# Patient Record
Sex: Female | Born: 1960 | Race: White | Hispanic: No | Marital: Married | Smoking: Never smoker
Health system: Southern US, Community
[De-identification: ages and names within clinical notes are randomized; demographics above are authoritative.]

## PROBLEM LIST (undated history)

## (undated) DIAGNOSIS — G459 Transient cerebral ischemic attack, unspecified: Secondary | ICD-10-CM

## (undated) HISTORY — PX: OTHER SURGICAL HISTORY: SHX169

## (undated) HISTORY — DX: Transient cerebral ischemic attack, unspecified: G45.9

## (undated) HISTORY — PX: WISDOM TOOTH EXTRACTION: SHX21

---

## 1998-02-16 ENCOUNTER — Other Ambulatory Visit: Admission: RE | Admit: 1998-02-16 | Discharge: 1998-02-16 | Payer: Self-pay | Admitting: Family Medicine

## 2002-08-17 ENCOUNTER — Other Ambulatory Visit: Admission: RE | Admit: 2002-08-17 | Discharge: 2002-08-17 | Payer: Self-pay | Admitting: Obstetrics and Gynecology

## 2006-11-06 ENCOUNTER — Encounter: Admission: RE | Admit: 2006-11-06 | Discharge: 2006-11-06 | Payer: Self-pay | Admitting: Obstetrics and Gynecology

## 2012-03-09 ENCOUNTER — Other Ambulatory Visit: Payer: Self-pay | Admitting: Obstetrics and Gynecology

## 2012-03-09 DIAGNOSIS — N6009 Solitary cyst of unspecified breast: Secondary | ICD-10-CM

## 2012-03-12 ENCOUNTER — Ambulatory Visit
Admission: RE | Admit: 2012-03-12 | Discharge: 2012-03-12 | Disposition: A | Discharge status: Home / Self Care | Payer: BC Managed Care – PPO | Source: Ambulatory Visit | Attending: Obstetrics and Gynecology | Admitting: Obstetrics and Gynecology

## 2012-03-12 DIAGNOSIS — N6009 Solitary cyst of unspecified breast: Secondary | ICD-10-CM

## 2012-03-12 IMAGING — US US BREAST*L*
1 series · 4 of 4 positions shown · non-contrast
Comparison: With priors

CLINICAL DATA: Palpable left breast abnormality

DIGITAL DIAGNOSTIC BILATERAL MAMMOGRAM WITH CAD AND LEFT BREAST
ULTRASOUND:

[Series 1: us breast*left* · 4 of 4 slices shown]
[im 1/4]
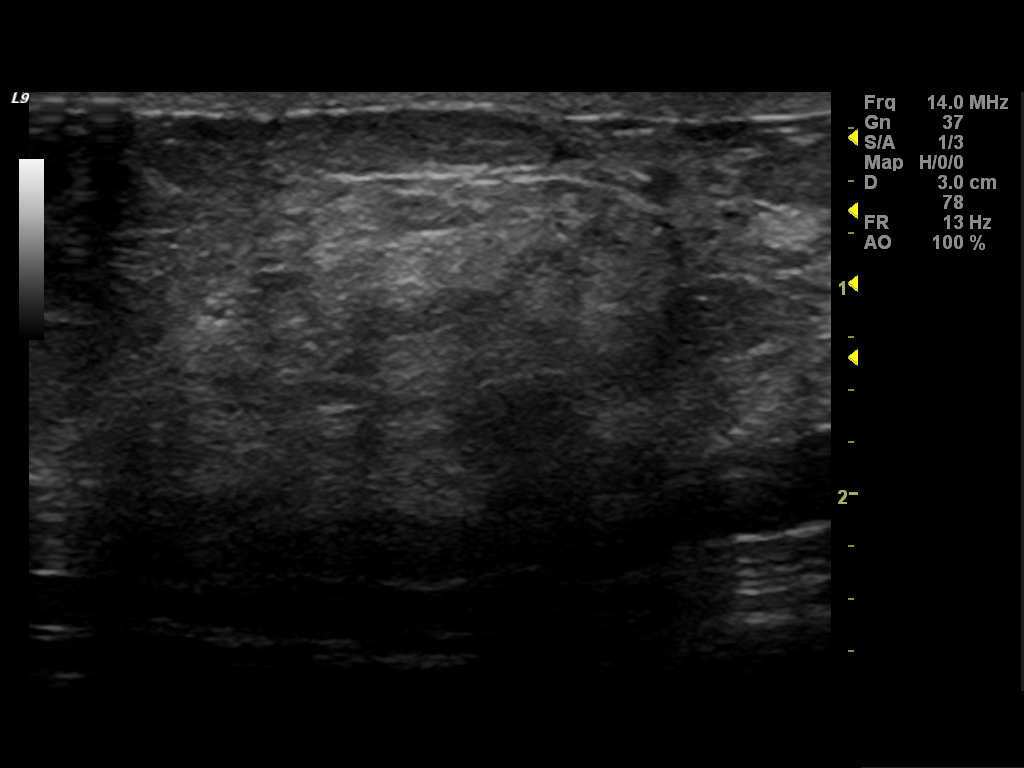
[im 2/4]
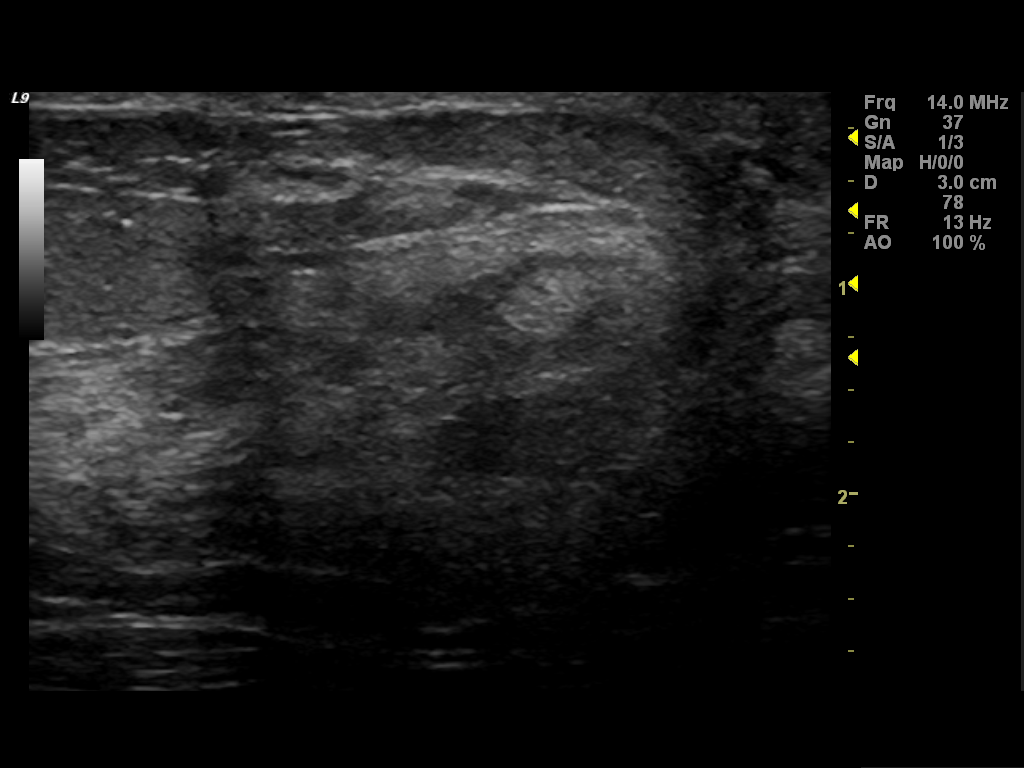
[im 3/4]
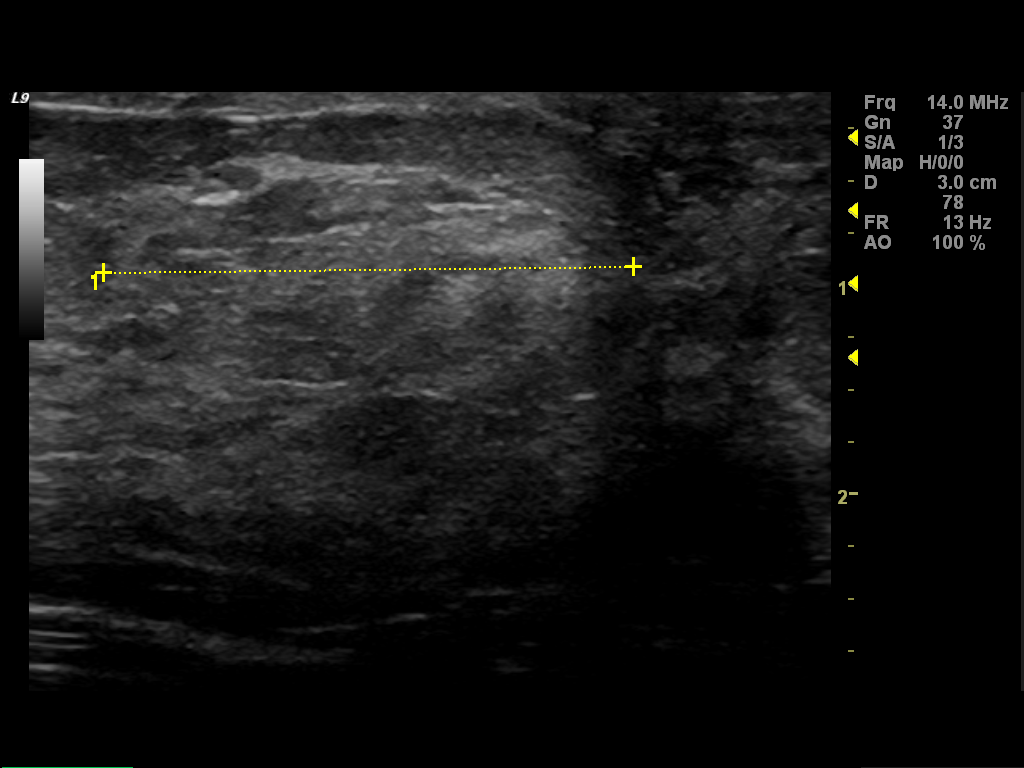
[im 4/4]
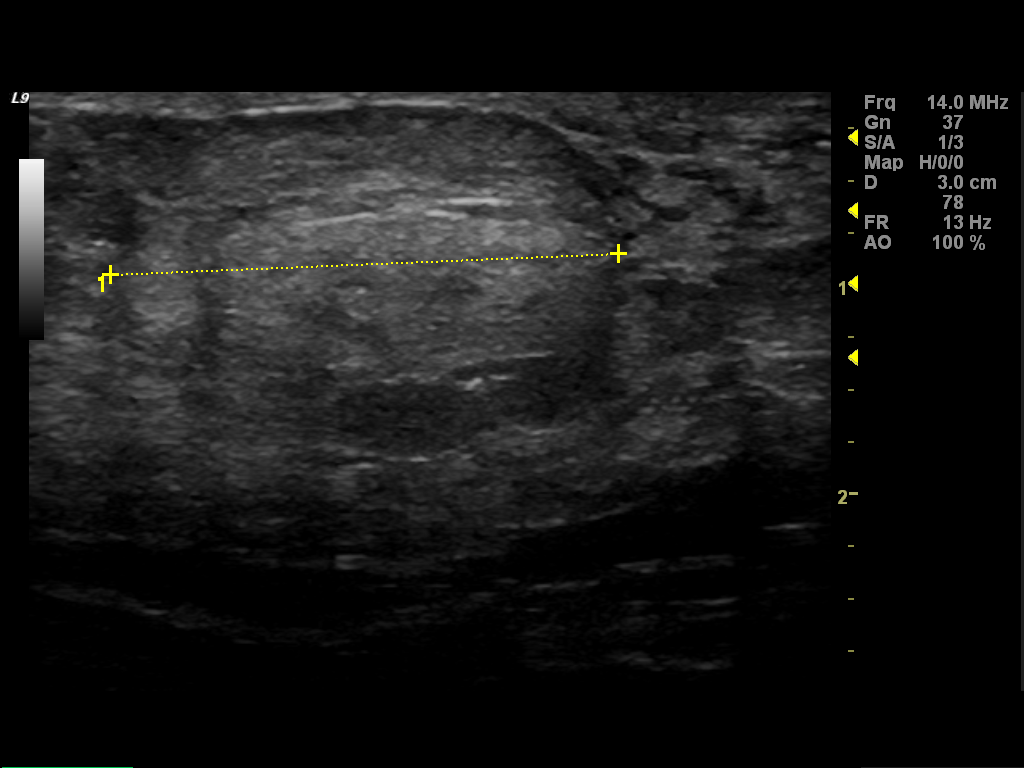

[4 of 4 positions shown; findings below may reference images not displayed]

FINDINGS: There is a dense fibroglandular pattern.  The right
breast is negative.

In the 1 o'clock, subareolar region of the left breast where the
patient can feel a palpable abnormality, there is a 1.7 cm rounded
area of normal breast tissue thought to represent a hamartoma.  No
suspicious mass or malignant-type microcalcifications identified in
the left breast.
Mammographic images were processed with CAD.

On physical exam, I palpate an area of thickening in the left
breast at 1 o'clock in a subareolar location.

Ultrasound is performed, showing there is a rounded area of mixed
echogenic tissue with central fat measuring 2.4 cm.  It has the
appearance of a hamartoma.
IMPRESSION: Probable benign hamartoma in the left breast.

RECOMMENDATION:
Short-term interval follow-up left ultrasound in 6 months is
recommended.  The importance of self breast examination was
discussed with the patient.

I have discussed the findings and recommendations with the patient.
Results were also provided in writing at the conclusion of the
visit.

BI-RADS CATEGORY 3:  Probably benign finding(s) - short interval
follow-up suggested.

## 2014-01-30 ENCOUNTER — Ambulatory Visit (INDEPENDENT_AMBULATORY_CARE_PROVIDER_SITE_OTHER): Payer: BC Managed Care – PPO | Admitting: Family Medicine

## 2014-01-30 ENCOUNTER — Encounter: Payer: Self-pay | Admitting: Family Medicine

## 2014-01-30 ENCOUNTER — Encounter (INDEPENDENT_AMBULATORY_CARE_PROVIDER_SITE_OTHER): Payer: Self-pay

## 2014-01-30 ENCOUNTER — Telehealth: Payer: Self-pay | Admitting: Family Medicine

## 2014-01-30 VITALS — BP 132/78 | HR 106 | Temp 97.6°F | Ht 65.5 in | Wt 153.0 lb

## 2014-01-30 DIAGNOSIS — J069 Acute upper respiratory infection, unspecified: Secondary | ICD-10-CM

## 2014-01-30 MED ORDER — AMOXICILLIN 875 MG PO TABS: 875.0000 mg | ORAL_TABLET | Freq: Two times a day (BID) | ORAL | Status: DC

## 2014-01-30 NOTE — Progress Notes (Signed)
   Subjective:    Patient ID: Tanya Morgan, female    DOB: 12/08/1960, 53 y.o.   MRN: [id number]  HPI This 53 y.o. female presents for evaluation of URI sx's and left eye conjunctivitis. She was rx'd ofloxacin 1 gtt qid and she is not getting better.   Review of Systems C/o uri sx's and left eye drainage. No chest pain, SOB, HA, dizziness, vision change, N/V, diarrhea, constipation, dysuria, urinary urgency or frequency, myalgias, arthralgias or rash.     Objective:   Physical Exam Vital signs noted  Well developed well nourished female.  HEENT - Head atraumatic Normocephalic                Eyes - PERRLA, Conjuctiva - injected OS clear OD Sclera- Clear EOMI                Ears - EAC's Wnl TM's Wnl Gross Hearing WNL                Throat - oropharanx wnl Respiratory - Lungs CTA bilateral Cardiac - RRR S1 and S2 without murmur GI - Abdomen soft Nontender and bowel sounds active x 4 Extremities - No edema. Neuro - Grossly intact.       Assessment & Plan:  URI (upper respiratory infection) - Plan: amoxicillin (AMOXIL) 875 MG tablet Push po fluids, rest, tylenol and motrin otc prn as directed for fever, arthralgias, and myalgias.  Follow up prn if sx's continue or persist.  Conjunctivitis - Increase eye gtt's to 2 gtt's OS every 2 hours W/A for a day and then 1 gtt OS q 2hours then 1 gtt every 3 hours then 1 gtt every 4 hours until total of 7 days.  Deatra Canter FNP

## 2014-01-30 NOTE — Telephone Encounter (Signed)
Eye swollen. bcbs and not taking any medications appt given for today at 4:30

## 2014-02-11 ENCOUNTER — Telehealth: Payer: Self-pay | Admitting: Family Medicine

## 2014-02-20 ENCOUNTER — Other Ambulatory Visit: Payer: Self-pay | Admitting: Family Medicine

## 2014-02-20 MED ORDER — OFLOXACIN 0.3 % OP SOLN: 1.0000 [drp] | Freq: Four times a day (QID) | OPHTHALMIC | Status: AC

## 2015-01-22 ENCOUNTER — Encounter: Payer: Self-pay | Admitting: Gastroenterology

## 2015-01-25 ENCOUNTER — Ambulatory Visit (AMBULATORY_SURGERY_CENTER): Payer: Self-pay

## 2015-01-25 VITALS — Ht 65.5 in | Wt 149.8 lb

## 2015-01-25 DIAGNOSIS — Z1211 Encounter for screening for malignant neoplasm of colon: Secondary | ICD-10-CM

## 2015-01-25 MED ORDER — MOVIPREP 100 G PO SOLR: 1.0000 | Freq: Once | ORAL | Status: DC

## 2015-01-25 NOTE — Progress Notes (Signed)
No allergies to eggs or soy No past problems with anesthesia No diet/weight loss meds No home oxygen  Has email  Emmi instructions given for colonoscopy 

## 2015-01-26 ENCOUNTER — Other Ambulatory Visit: Payer: Self-pay

## 2015-02-07 ENCOUNTER — Ambulatory Visit (AMBULATORY_SURGERY_CENTER): Payer: BLUE CROSS/BLUE SHIELD | Admitting: Gastroenterology

## 2015-02-07 ENCOUNTER — Encounter: Payer: Self-pay | Admitting: Gastroenterology

## 2015-02-07 VITALS — BP 110/56 | HR 78 | Temp 97.0°F | Resp 14 | Ht 65.0 in | Wt 149.0 lb

## 2015-02-07 DIAGNOSIS — K5730 Diverticulosis of large intestine without perforation or abscess without bleeding: Secondary | ICD-10-CM

## 2015-02-07 DIAGNOSIS — Z1211 Encounter for screening for malignant neoplasm of colon: Secondary | ICD-10-CM | POA: Diagnosis not present

## 2015-02-07 MED ORDER — SODIUM CHLORIDE 0.9 % IV SOLN: 500.0000 mL | INTRAVENOUS | Status: DC

## 2015-02-07 NOTE — Op Note (Signed)
[facility] [address] [place], [address]   COLONOSCOPY PROCEDURE REPORT  PATIENT: Tanya, Morgan  MR#: [id number] BIRTHDATE: 07-Feb-1961 , 54  yrs. old GENDER: female ENDOSCOPIST: Rachael Fee, MD REFERRED XB:[id number] Vincente Poli, M.D. PROCEDURE DATE:  02/07/2015 PROCEDURE:   Colonoscopy, screening First Screening Colonoscopy - Avg.  risk and is 50 yrs.  old or older Yes.  Prior Negative Screening - Now for repeat screening. N/A  History of Adenoma - Now for follow-up colonoscopy & has been > or = to 3 yrs.  N/A  Recommend repeat exam, <10 yrs? No ASA CLASS:   Class II INDICATIONS:Screening for colonic neoplasia and Colorectal Neoplasm Risk Assessment for this procedure is average risk. MEDICATIONS: Monitored anesthesia care and Propofol 150 mg IV  DESCRIPTION OF PROCEDURE:   After the risks benefits and alternatives of the procedure were thoroughly explained, informed consent was obtained.  The digital rectal exam revealed no abnormalities of the rectum.   The LB [id number]  endoscope was introduced through the anus and advanced to the cecum, which was identified by both the appendix and ileocecal valve. No adverse events experienced.   The quality of the prep was excellent.  The instrument was then slowly withdrawn as the colon was fully examined. Estimated blood loss is zero unless otherwise noted in this procedure report.   COLON FINDINGS: There was mild diverticulosis noted throughout the entire examined colon.   The examination was otherwise normal. Retroflexed views revealed no abnormalities. The time to cecum = 2.3 Withdrawal time = 6.7   The scope was withdrawn and the procedure completed. COMPLICATIONS: There were no immediate complications.  ENDOSCOPIC IMPRESSION: 1.   Mild diverticulosis was noted throughout the entire examined colon 2.   The examination was otherwise normal  RECOMMENDATIONS: You should continue to follow colorectal  cancer screening guidelines for "routine risk" patients with a repeat colonoscopy in 10 years. There is no need for FOBT (stool) testing for at least 5 years.  eSigned:  Rachael Fee, MD 02/07/2015 11:14 AM

## 2015-02-07 NOTE — Progress Notes (Signed)
Report to PACU, RN, vss, BBS= Clear.  

## 2015-02-07 NOTE — Patient Instructions (Signed)
YOU HAD AN ENDOSCOPIC PROCEDURE TODAY AT THE [facility]:   Refer to the procedure report that was given to you for any specific questions about what was found during the examination.  If the procedure report does not answer your questions, please call your gastroenterologist to clarify.  If you requested that your care partner not be given the details of your procedure findings, then the procedure report has been included in a sealed envelope for you to review at your convenience later.  YOU SHOULD EXPECT: Some feelings of bloating in the abdomen. Passage of more gas than usual.  Walking can help get rid of the air that was put into your GI tract during the procedure and reduce the bloating. If you had a lower endoscopy (such as a colonoscopy or flexible sigmoidoscopy) you may notice spotting of blood in your stool or on the toilet paper. If you underwent a bowel prep for your procedure, you may not have a normal bowel movement for a few days.  Please Note:  You might notice some irritation and congestion in your nose or some drainage.  This is from the oxygen used during your procedure.  There is no need for concern and it should clear up in a day or so.  SYMPTOMS TO REPORT IMMEDIATELY:   Following lower endoscopy (colonoscopy or flexible sigmoidoscopy):  Excessive amounts of blood in the stool  Significant tenderness or worsening of abdominal pains  Swelling of the abdomen that is new, acute  Fever of 100F or higher   For urgent or emergent issues, a gastroenterologist can be reached at any hour by calling [phone].   DIET: Your first meal following the procedure should be a small meal and then it is ok to progress to your normal diet. Heavy or fried foods are harder to digest and may make you feel nauseous or bloated.  Likewise, meals heavy in dairy and vegetables can increase bloating.  Drink plenty of fluids but you should avoid alcoholic beverages for 24  hours.  ACTIVITY:  You should plan to take it easy for the rest of today and you should NOT DRIVE or use heavy machinery until tomorrow (because of the sedation medicines used during the test).    FOLLOW UP: Our staff will call the number listed on your records the next business day following your procedure to check on you and address any questions or concerns that you may have regarding the information given to you following your procedure. If we do not reach you, we will leave a message.  However, if you are feeling well and you are not experiencing any problems, there is no need to return our call.  We will assume that you have returned to your regular daily activities without incident.  If any biopsies were taken you will be contacted by phone or by letter within the next 1-3 weeks.  Please call us at [phone] if you have not heard about the biopsies in 3 weeks.    SIGNATURES/CONFIDENTIALITY: You and/or your care partner have signed paperwork which will be entered into your electronic medical record.  These signatures attest to the fact that that the information above on your After Visit Summary has been reviewed and is understood.  Full responsibility of the confidentiality of this discharge information lies with you and/or your care-partner.  Please review diverticulosis and high fiber diet handouts provided. 

## 2015-02-08 ENCOUNTER — Telehealth: Payer: Self-pay | Admitting: *Deleted

## 2015-02-08 NOTE — Telephone Encounter (Signed)
  Follow up Call-  Call back number 02/07/2015  Post procedure Call Back phone  # [phone]  Permission to leave phone message Yes     Patient questions:  Do you have a fever, pain , or abdominal swelling? No. Pain Score  0 *  Have you tolerated food without any problems? Yes.    Have you been able to return to your normal activities? Yes.    Do you have any questions about your discharge instructions: Diet   No. Medications  No. Follow up visit  No.  Do you have questions or concerns about your Care? No.  Actions: * If pain score is 4 or above: No action needed, pain <4.

## 2016-02-07 DIAGNOSIS — R5383 Other fatigue: Secondary | ICD-10-CM | POA: Diagnosis not present

## 2016-02-07 DIAGNOSIS — Z1231 Encounter for screening mammogram for malignant neoplasm of breast: Secondary | ICD-10-CM | POA: Diagnosis not present

## 2016-02-07 DIAGNOSIS — Z01419 Encounter for gynecological examination (general) (routine) without abnormal findings: Secondary | ICD-10-CM | POA: Diagnosis not present

## 2016-05-03 DIAGNOSIS — J329 Chronic sinusitis, unspecified: Secondary | ICD-10-CM | POA: Diagnosis not present

## 2016-09-25 DIAGNOSIS — G8929 Other chronic pain: Secondary | ICD-10-CM | POA: Diagnosis not present

## 2016-09-25 DIAGNOSIS — M1611 Unilateral primary osteoarthritis, right hip: Secondary | ICD-10-CM | POA: Diagnosis not present

## 2016-09-25 DIAGNOSIS — M545 Low back pain: Secondary | ICD-10-CM | POA: Diagnosis not present

## 2016-11-26 DIAGNOSIS — M1611 Unilateral primary osteoarthritis, right hip: Secondary | ICD-10-CM | POA: Diagnosis not present

## 2016-11-26 DIAGNOSIS — M25551 Pain in right hip: Secondary | ICD-10-CM | POA: Diagnosis not present

## 2016-12-08 DIAGNOSIS — R030 Elevated blood-pressure reading, without diagnosis of hypertension: Secondary | ICD-10-CM | POA: Diagnosis not present

## 2016-12-08 DIAGNOSIS — Z6824 Body mass index (BMI) 24.0-24.9, adult: Secondary | ICD-10-CM | POA: Diagnosis not present

## 2016-12-08 DIAGNOSIS — M1612 Unilateral primary osteoarthritis, left hip: Secondary | ICD-10-CM | POA: Diagnosis not present

## 2017-01-02 DIAGNOSIS — M1611 Unilateral primary osteoarthritis, right hip: Secondary | ICD-10-CM | POA: Diagnosis not present

## 2017-01-15 DIAGNOSIS — Z96651 Presence of right artificial knee joint: Secondary | ICD-10-CM | POA: Diagnosis not present

## 2017-01-15 DIAGNOSIS — M1611 Unilateral primary osteoarthritis, right hip: Secondary | ICD-10-CM | POA: Diagnosis not present

## 2017-06-30 DIAGNOSIS — Z6824 Body mass index (BMI) 24.0-24.9, adult: Secondary | ICD-10-CM | POA: Diagnosis not present

## 2017-06-30 DIAGNOSIS — J111 Influenza due to unidentified influenza virus with other respiratory manifestations: Secondary | ICD-10-CM | POA: Diagnosis not present

## 2017-09-29 DIAGNOSIS — L738 Other specified follicular disorders: Secondary | ICD-10-CM | POA: Diagnosis not present

## 2017-09-29 DIAGNOSIS — L570 Actinic keratosis: Secondary | ICD-10-CM | POA: Diagnosis not present

## 2017-09-29 DIAGNOSIS — L814 Other melanin hyperpigmentation: Secondary | ICD-10-CM | POA: Diagnosis not present

## 2017-09-29 DIAGNOSIS — D485 Neoplasm of uncertain behavior of skin: Secondary | ICD-10-CM | POA: Diagnosis not present

## 2017-09-29 DIAGNOSIS — L739 Follicular disorder, unspecified: Secondary | ICD-10-CM | POA: Diagnosis not present

## 2017-11-04 DIAGNOSIS — S2002XA Contusion of left breast, initial encounter: Secondary | ICD-10-CM | POA: Diagnosis not present

## 2017-11-04 DIAGNOSIS — Z6825 Body mass index (BMI) 25.0-25.9, adult: Secondary | ICD-10-CM | POA: Diagnosis not present

## 2017-11-04 DIAGNOSIS — N644 Mastodynia: Secondary | ICD-10-CM | POA: Diagnosis not present

## 2017-11-05 ENCOUNTER — Other Ambulatory Visit: Payer: Self-pay | Admitting: General Practice

## 2017-11-05 DIAGNOSIS — N644 Mastodynia: Secondary | ICD-10-CM

## 2017-11-10 ENCOUNTER — Other Ambulatory Visit: Payer: BLUE CROSS/BLUE SHIELD

## 2018-05-25 DIAGNOSIS — Z6825 Body mass index (BMI) 25.0-25.9, adult: Secondary | ICD-10-CM | POA: Diagnosis not present

## 2018-05-25 DIAGNOSIS — Z1212 Encounter for screening for malignant neoplasm of rectum: Secondary | ICD-10-CM | POA: Diagnosis not present

## 2018-05-25 DIAGNOSIS — Z01419 Encounter for gynecological examination (general) (routine) without abnormal findings: Secondary | ICD-10-CM | POA: Diagnosis not present

## 2018-05-25 DIAGNOSIS — Z1231 Encounter for screening mammogram for malignant neoplasm of breast: Secondary | ICD-10-CM | POA: Diagnosis not present

## 2018-08-26 DIAGNOSIS — N390 Urinary tract infection, site not specified: Secondary | ICD-10-CM | POA: Diagnosis not present

## 2018-08-26 DIAGNOSIS — Z6824 Body mass index (BMI) 24.0-24.9, adult: Secondary | ICD-10-CM | POA: Diagnosis not present

## 2018-11-04 DIAGNOSIS — Z0000 Encounter for general adult medical examination without abnormal findings: Secondary | ICD-10-CM | POA: Diagnosis not present

## 2018-11-09 DIAGNOSIS — Z6824 Body mass index (BMI) 24.0-24.9, adult: Secondary | ICD-10-CM | POA: Diagnosis not present

## 2018-11-09 DIAGNOSIS — Z0000 Encounter for general adult medical examination without abnormal findings: Secondary | ICD-10-CM | POA: Diagnosis not present

## 2019-06-14 DIAGNOSIS — R110 Nausea: Secondary | ICD-10-CM | POA: Diagnosis not present

## 2019-06-14 DIAGNOSIS — R079 Chest pain, unspecified: Secondary | ICD-10-CM | POA: Diagnosis not present

## 2019-06-14 DIAGNOSIS — Z7982 Long term (current) use of aspirin: Secondary | ICD-10-CM | POA: Diagnosis not present

## 2019-06-14 DIAGNOSIS — R0789 Other chest pain: Secondary | ICD-10-CM | POA: Diagnosis not present

## 2019-06-17 DIAGNOSIS — R42 Dizziness and giddiness: Secondary | ICD-10-CM | POA: Diagnosis not present

## 2019-06-17 DIAGNOSIS — Z6826 Body mass index (BMI) 26.0-26.9, adult: Secondary | ICD-10-CM | POA: Diagnosis not present

## 2019-06-17 DIAGNOSIS — R404 Transient alteration of awareness: Secondary | ICD-10-CM | POA: Diagnosis not present

## 2019-06-23 DIAGNOSIS — Z1231 Encounter for screening mammogram for malignant neoplasm of breast: Secondary | ICD-10-CM | POA: Diagnosis not present

## 2019-06-23 DIAGNOSIS — Z01419 Encounter for gynecological examination (general) (routine) without abnormal findings: Secondary | ICD-10-CM | POA: Diagnosis not present

## 2019-06-23 DIAGNOSIS — Z6825 Body mass index (BMI) 25.0-25.9, adult: Secondary | ICD-10-CM | POA: Diagnosis not present

## 2019-06-23 DIAGNOSIS — N390 Urinary tract infection, site not specified: Secondary | ICD-10-CM | POA: Diagnosis not present

## 2019-09-21 DIAGNOSIS — R509 Fever, unspecified: Secondary | ICD-10-CM | POA: Diagnosis not present

## 2019-09-21 DIAGNOSIS — J209 Acute bronchitis, unspecified: Secondary | ICD-10-CM | POA: Diagnosis not present

## 2019-09-21 DIAGNOSIS — Z20828 Contact with and (suspected) exposure to other viral communicable diseases: Secondary | ICD-10-CM | POA: Diagnosis not present

## 2019-12-05 DIAGNOSIS — R030 Elevated blood-pressure reading, without diagnosis of hypertension: Secondary | ICD-10-CM | POA: Diagnosis not present

## 2019-12-05 DIAGNOSIS — E782 Mixed hyperlipidemia: Secondary | ICD-10-CM | POA: Diagnosis not present

## 2019-12-07 DIAGNOSIS — Z0000 Encounter for general adult medical examination without abnormal findings: Secondary | ICD-10-CM | POA: Diagnosis not present

## 2019-12-12 DIAGNOSIS — Z23 Encounter for immunization: Secondary | ICD-10-CM | POA: Diagnosis not present

## 2020-01-21 DIAGNOSIS — Z20822 Contact with and (suspected) exposure to covid-19: Secondary | ICD-10-CM | POA: Diagnosis not present

## 2022-09-08 ENCOUNTER — Other Ambulatory Visit: Payer: Self-pay | Admitting: Obstetrics and Gynecology

## 2022-09-08 DIAGNOSIS — R928 Other abnormal and inconclusive findings on diagnostic imaging of breast: Secondary | ICD-10-CM

## 2022-09-10 ENCOUNTER — Ambulatory Visit
Admission: RE | Admit: 2022-09-10 | Discharge: 2022-09-10 | Disposition: A | Discharge status: Home / Self Care | Payer: BLUE CROSS/BLUE SHIELD | Source: Ambulatory Visit | Attending: Obstetrics and Gynecology | Admitting: Obstetrics and Gynecology

## 2022-09-10 ENCOUNTER — Ambulatory Visit
Admission: RE | Admit: 2022-09-10 | Discharge: 2022-09-10 | Disposition: A | Discharge status: Home / Self Care | Payer: BC Managed Care – PPO | Source: Ambulatory Visit | Attending: Obstetrics and Gynecology | Admitting: Obstetrics and Gynecology

## 2022-09-10 DIAGNOSIS — R928 Other abnormal and inconclusive findings on diagnostic imaging of breast: Secondary | ICD-10-CM
